# Patient Record
Sex: Female | Born: 1955 | Race: White | Hispanic: Yes | Marital: Married
Health system: Southern US, Community
[De-identification: ages and names within clinical notes are randomized; demographics above are authoritative.]

---

## 2006-08-07 ENCOUNTER — Ambulatory Visit (HOSPITAL_COMMUNITY): Admission: RE | Admit: 2006-08-07 | Discharge: 2006-08-07 | Payer: Self-pay | Admitting: General Surgery

## 2007-04-09 ENCOUNTER — Ambulatory Visit (HOSPITAL_COMMUNITY): Admission: RE | Admit: 2007-04-09 | Discharge: 2007-04-09 | Payer: Self-pay | Admitting: Family Medicine

## 2007-08-13 ENCOUNTER — Ambulatory Visit (HOSPITAL_COMMUNITY): Admission: RE | Admit: 2007-08-13 | Discharge: 2007-08-13 | Payer: Self-pay | Admitting: Family Medicine

## 2008-08-27 ENCOUNTER — Ambulatory Visit (HOSPITAL_COMMUNITY): Admission: RE | Admit: 2008-08-27 | Discharge: 2008-08-27 | Payer: Self-pay | Admitting: Family Medicine

## 2010-05-08 ENCOUNTER — Ambulatory Visit (HOSPITAL_COMMUNITY): Admission: RE | Admit: 2010-05-08 | Discharge: 2010-05-08 | Payer: Self-pay | Admitting: Family Medicine

## 2011-10-23 ENCOUNTER — Other Ambulatory Visit (HOSPITAL_COMMUNITY): Payer: Self-pay | Admitting: Family Medicine

## 2011-10-23 DIAGNOSIS — Z139 Encounter for screening, unspecified: Secondary | ICD-10-CM

## 2011-10-25 ENCOUNTER — Ambulatory Visit (HOSPITAL_COMMUNITY)
Admission: RE | Admit: 2011-10-25 | Discharge: 2011-10-25 | Disposition: A | Discharge status: Home / Self Care | Payer: PRIVATE HEALTH INSURANCE | Source: Ambulatory Visit | Attending: Family Medicine | Admitting: Family Medicine

## 2011-10-25 DIAGNOSIS — Z1231 Encounter for screening mammogram for malignant neoplasm of breast: Secondary | ICD-10-CM | POA: Insufficient documentation

## 2011-10-25 DIAGNOSIS — Z139 Encounter for screening, unspecified: Secondary | ICD-10-CM

## 2013-04-30 ENCOUNTER — Other Ambulatory Visit (HOSPITAL_COMMUNITY): Payer: Self-pay | Admitting: *Deleted

## 2013-04-30 DIAGNOSIS — Z139 Encounter for screening, unspecified: Secondary | ICD-10-CM

## 2013-05-04 ENCOUNTER — Ambulatory Visit (HOSPITAL_COMMUNITY)
Admission: RE | Admit: 2013-05-04 | Discharge: 2013-05-04 | Disposition: A | Discharge status: Home / Self Care | Payer: PRIVATE HEALTH INSURANCE | Source: Ambulatory Visit | Attending: *Deleted | Admitting: *Deleted

## 2013-05-04 DIAGNOSIS — Z1231 Encounter for screening mammogram for malignant neoplasm of breast: Secondary | ICD-10-CM | POA: Insufficient documentation

## 2013-05-04 DIAGNOSIS — Z139 Encounter for screening, unspecified: Secondary | ICD-10-CM

## 2014-05-04 ENCOUNTER — Other Ambulatory Visit (HOSPITAL_COMMUNITY): Payer: Self-pay | Admitting: *Deleted

## 2014-05-04 DIAGNOSIS — Z1231 Encounter for screening mammogram for malignant neoplasm of breast: Secondary | ICD-10-CM

## 2014-05-10 ENCOUNTER — Ambulatory Visit (HOSPITAL_COMMUNITY)
Admission: RE | Admit: 2014-05-10 | Discharge: 2014-05-10 | Disposition: A | Discharge status: Home / Self Care | Payer: PRIVATE HEALTH INSURANCE | Source: Ambulatory Visit | Attending: *Deleted | Admitting: *Deleted

## 2014-05-10 DIAGNOSIS — Z1231 Encounter for screening mammogram for malignant neoplasm of breast: Secondary | ICD-10-CM | POA: Diagnosis not present

## 2015-07-07 ENCOUNTER — Other Ambulatory Visit (HOSPITAL_COMMUNITY): Payer: Self-pay | Admitting: *Deleted

## 2015-07-07 DIAGNOSIS — Z1231 Encounter for screening mammogram for malignant neoplasm of breast: Secondary | ICD-10-CM

## 2015-07-13 ENCOUNTER — Ambulatory Visit (HOSPITAL_COMMUNITY)
Admission: RE | Admit: 2015-07-13 | Discharge: 2015-07-13 | Disposition: A | Discharge status: Home / Self Care | Payer: PRIVATE HEALTH INSURANCE | Source: Ambulatory Visit | Attending: *Deleted | Admitting: *Deleted

## 2015-07-13 DIAGNOSIS — Z1231 Encounter for screening mammogram for malignant neoplasm of breast: Secondary | ICD-10-CM | POA: Diagnosis present

## 2016-06-15 ENCOUNTER — Other Ambulatory Visit (HOSPITAL_COMMUNITY): Payer: Self-pay | Admitting: *Deleted

## 2016-06-15 DIAGNOSIS — R1084 Generalized abdominal pain: Secondary | ICD-10-CM

## 2016-06-20 ENCOUNTER — Ambulatory Visit (HOSPITAL_COMMUNITY)
Admission: RE | Admit: 2016-06-20 | Discharge: 2016-06-20 | Disposition: A | Discharge status: Home / Self Care | Payer: Self-pay | Source: Ambulatory Visit | Attending: *Deleted | Admitting: *Deleted

## 2016-06-20 DIAGNOSIS — R1011 Right upper quadrant pain: Secondary | ICD-10-CM | POA: Insufficient documentation

## 2016-06-20 DIAGNOSIS — Z90710 Acquired absence of both cervix and uterus: Secondary | ICD-10-CM | POA: Insufficient documentation

## 2016-06-20 DIAGNOSIS — Z9049 Acquired absence of other specified parts of digestive tract: Secondary | ICD-10-CM | POA: Insufficient documentation

## 2016-06-20 DIAGNOSIS — R1084 Generalized abdominal pain: Secondary | ICD-10-CM

## 2016-06-20 DIAGNOSIS — Q453 Other congenital malformations of pancreas and pancreatic duct: Secondary | ICD-10-CM | POA: Insufficient documentation

## 2016-07-26 ENCOUNTER — Other Ambulatory Visit (HOSPITAL_COMMUNITY): Payer: Self-pay | Admitting: *Deleted

## 2016-07-26 DIAGNOSIS — Z1231 Encounter for screening mammogram for malignant neoplasm of breast: Secondary | ICD-10-CM

## 2016-08-08 ENCOUNTER — Ambulatory Visit (HOSPITAL_COMMUNITY): Payer: PRIVATE HEALTH INSURANCE

## 2016-08-09 ENCOUNTER — Ambulatory Visit (HOSPITAL_COMMUNITY)
Admission: RE | Admit: 2016-08-09 | Discharge: 2016-08-09 | Disposition: A | Discharge status: Home / Self Care | Payer: PRIVATE HEALTH INSURANCE | Source: Ambulatory Visit | Attending: *Deleted | Admitting: *Deleted

## 2016-08-09 DIAGNOSIS — Z1231 Encounter for screening mammogram for malignant neoplasm of breast: Secondary | ICD-10-CM

## 2016-10-23 IMAGING — MG MM DIGITAL SCREENING
8 series · 9 of 24 positions shown · non-contrast
Comparison: Previous exam(s).

CLINICAL DATA: Screening.

EXAM:
DIGITAL SCREENING BILATERAL MAMMOGRAM WITH 3D TOMO WITH CAD

[R MLO]
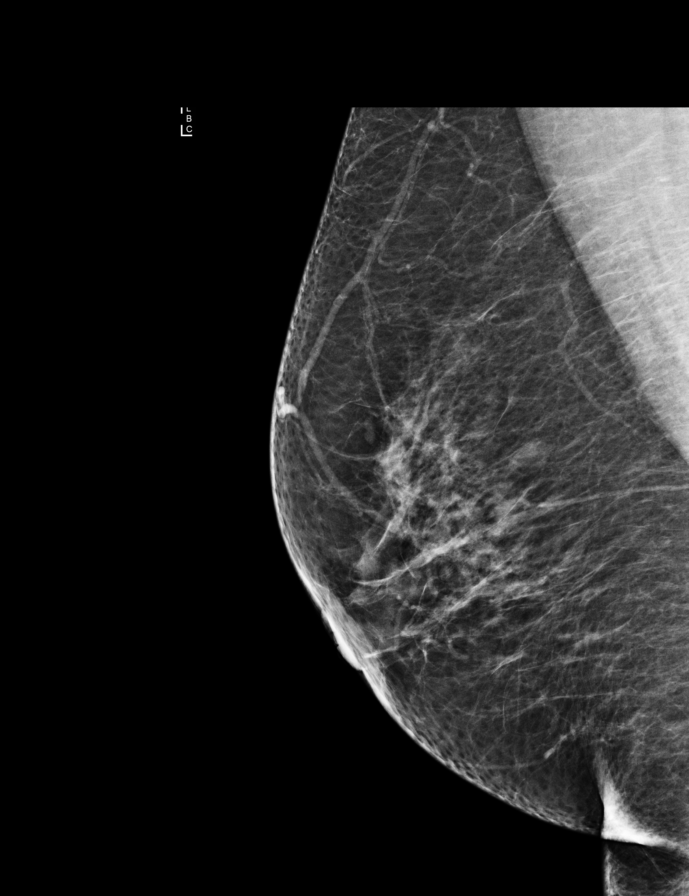

[L CC]
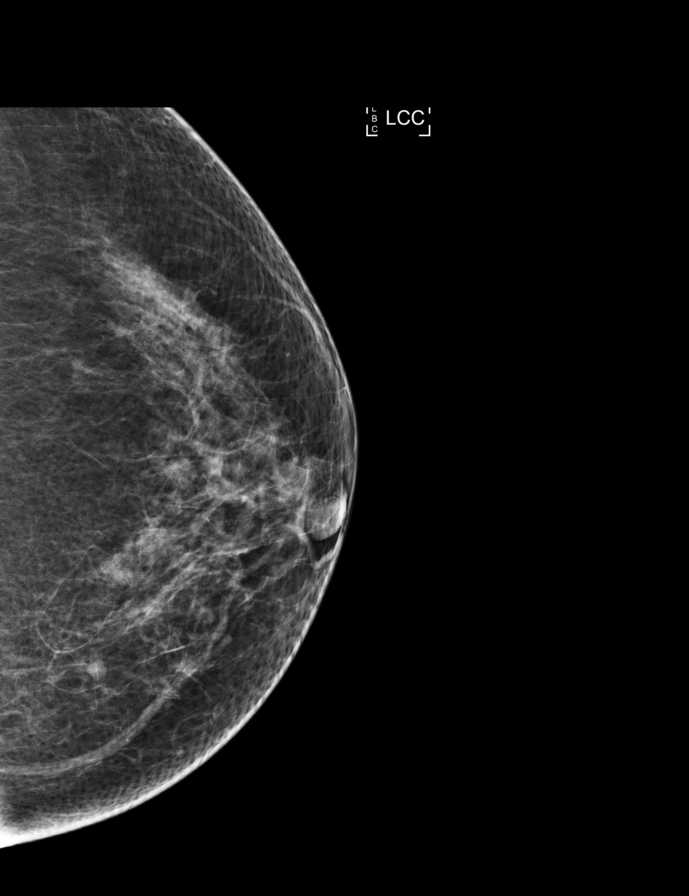

[R CC]
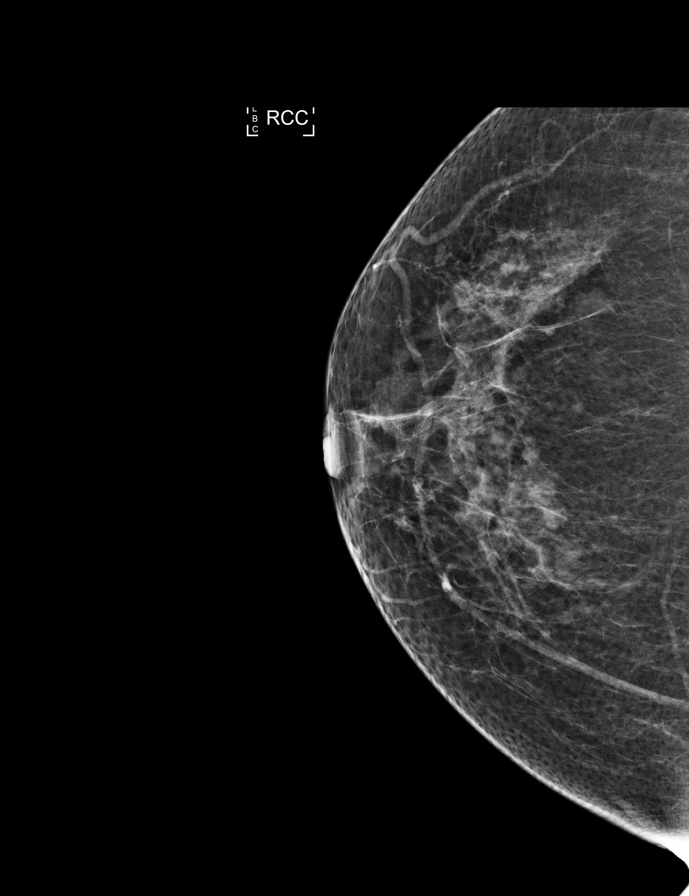

[L MLO]
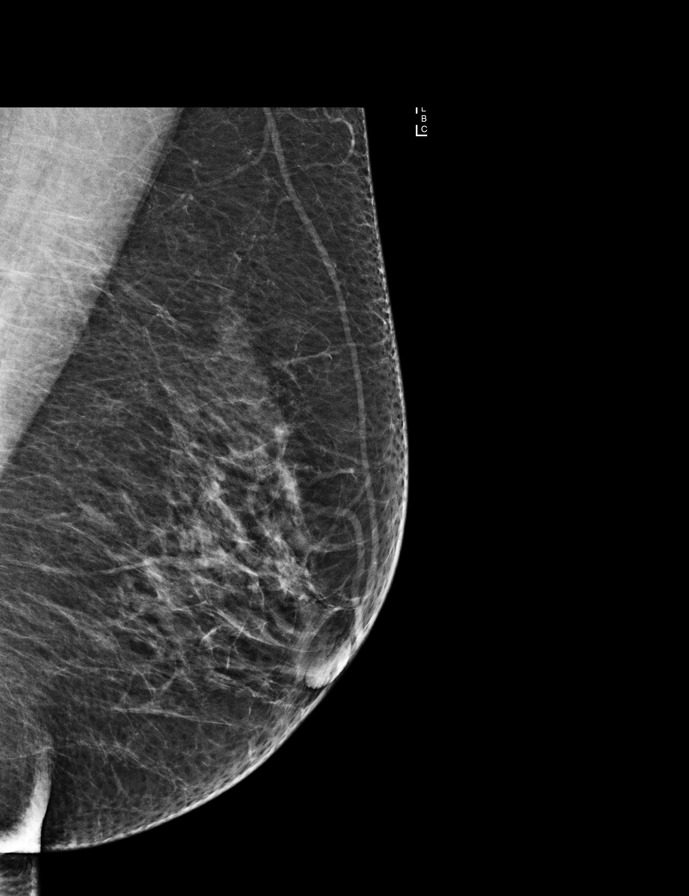

[R CC tomo · 2 of 55 frames shown]
[frame 18/55]
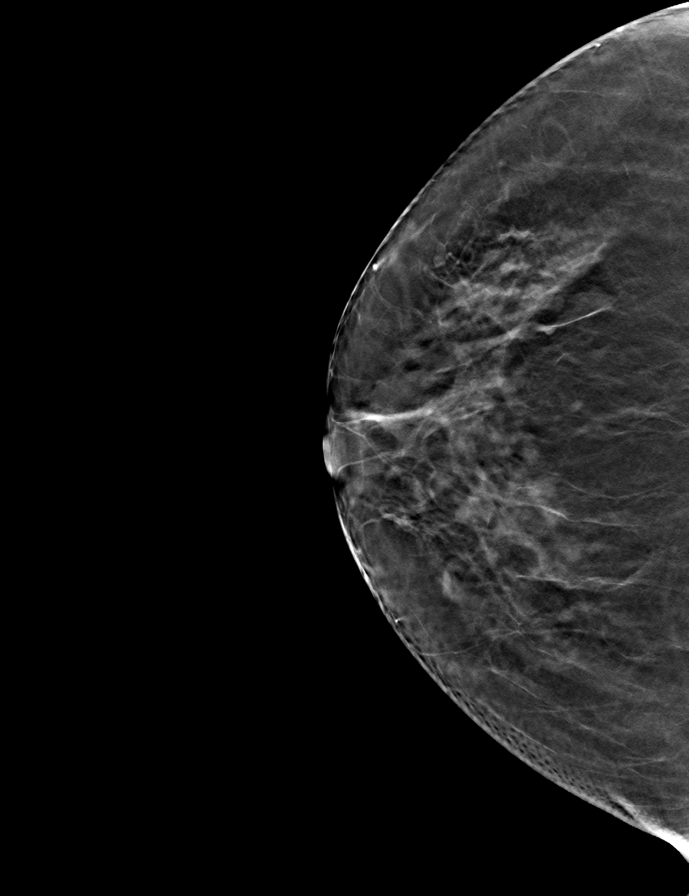
[frame 28/55]
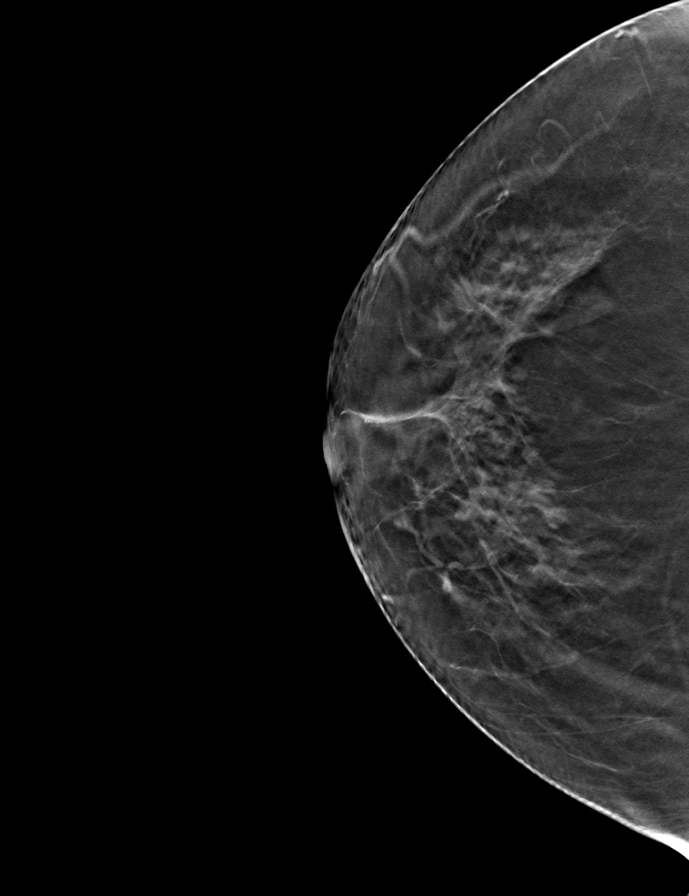

[R MLO tomo · tomo slice 31/62.0]
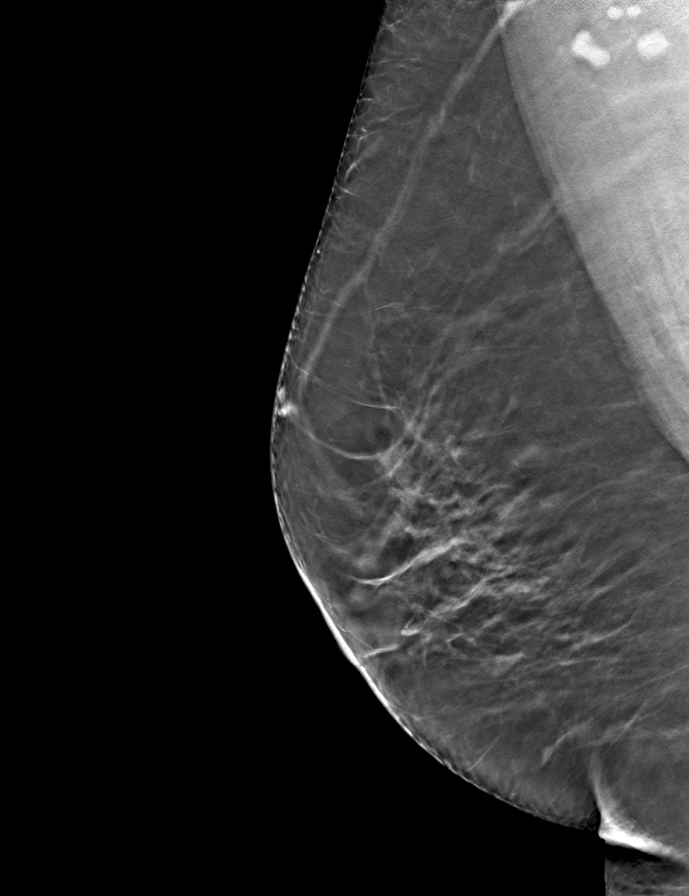

[L MLO tomo · tomo slice 29/57.0]
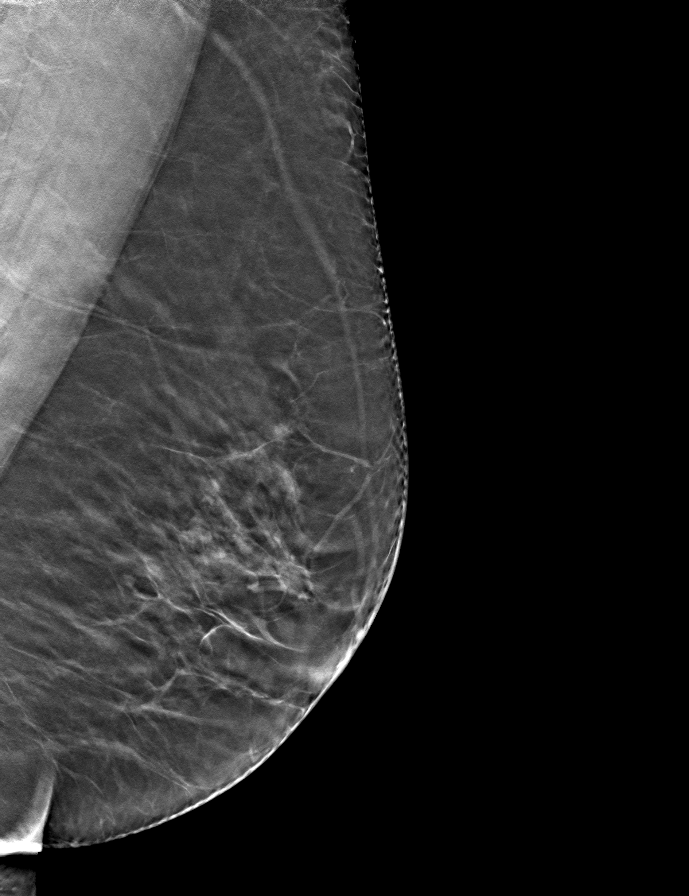

[L CC tomo · tomo slice 30/59.0]
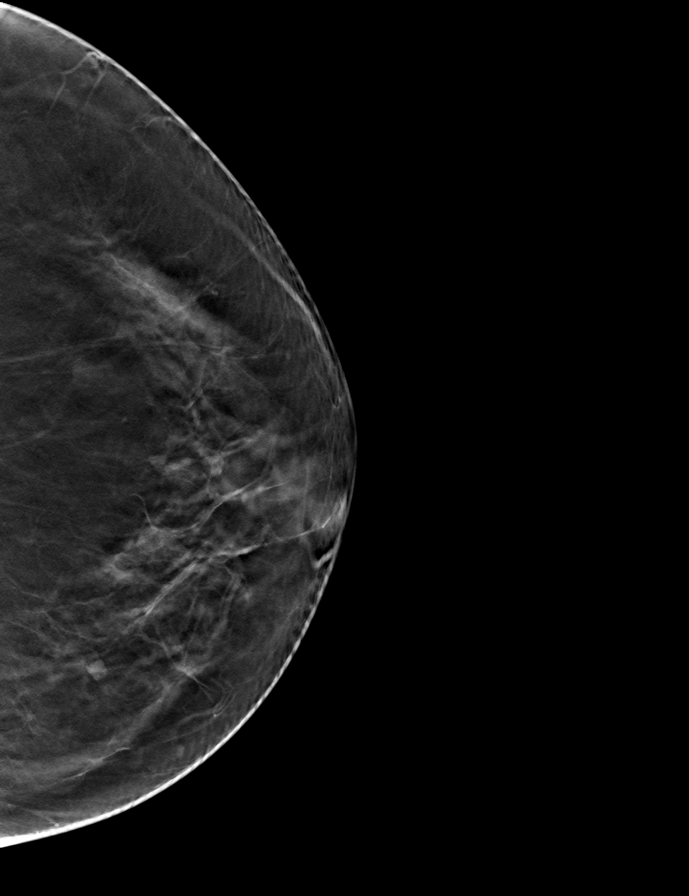

[9 of 24 positions shown; findings below may reference images not displayed]

ACR Breast Density Category b: There are scattered areas of
fibroglandular density.
FINDINGS: There are no findings suspicious for malignancy. Images were
processed with CAD.
IMPRESSION: No mammographic evidence of malignancy. A result letter of this
screening mammogram will be mailed directly to the patient.

RECOMMENDATION:
Screening mammogram in one year. (Code:[id number]-[id number])

BI-RADS CATEGORY  1: Negative.

## 2017-04-27 IMAGING — US US PELVIS COMPLETE
1 series · 14 of 25 positions shown · non-contrast
Comparison: None.

CLINICAL DATA: Chronic generalized abdominal pain.

EXAM:
TRANSABDOMINAL ULTRASOUND OF PELVIS
TECHNIQUE: Transabdominal ultrasound examination of the pelvis was performed
including evaluation of the uterus, ovaries, adnexal regions, and
pelvic cul-de-sac.

[Series 1: us pelvis complete · 0.26mm/px · 14 of 26 slices shown]
[im 1/26]
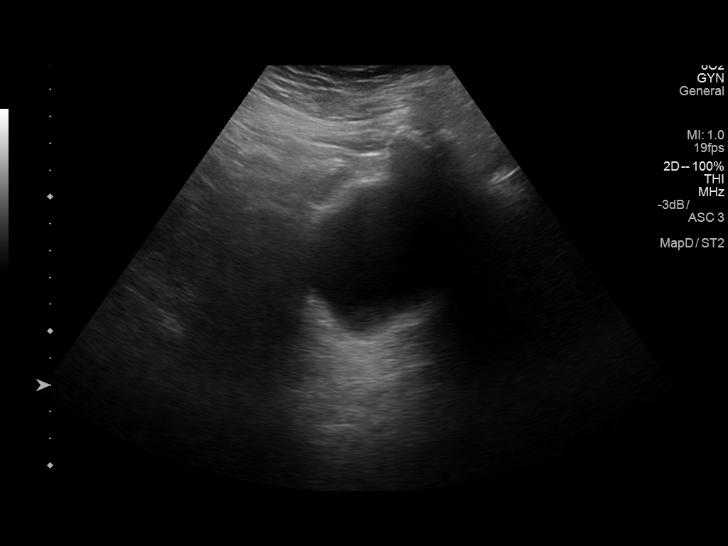
[im 3/26]
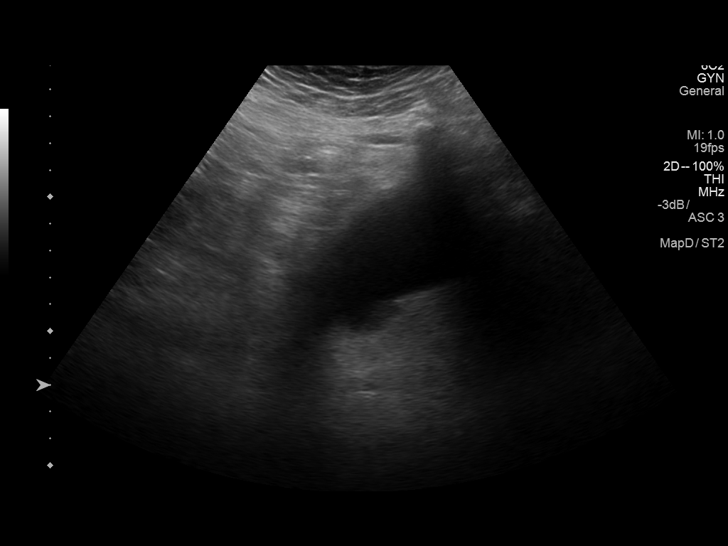
[im 5/26]
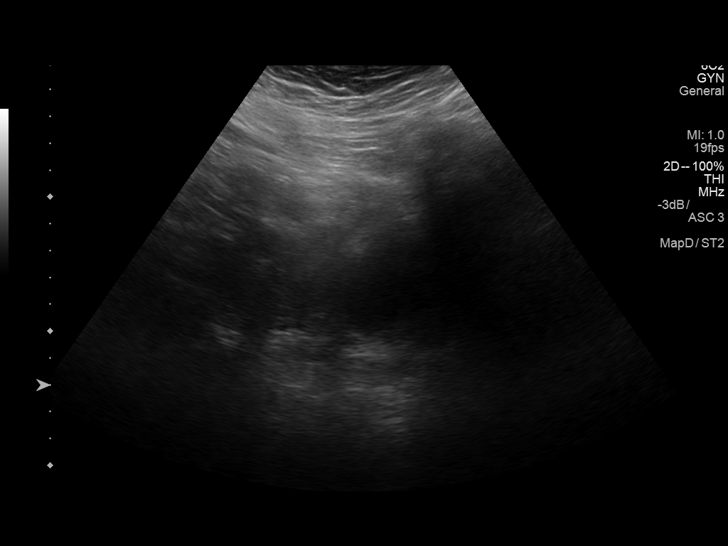
[im 7/26]
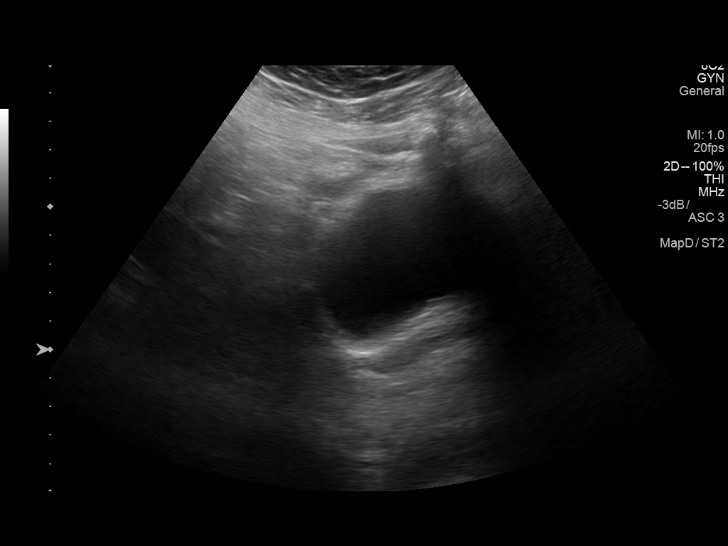
[im 9/26]
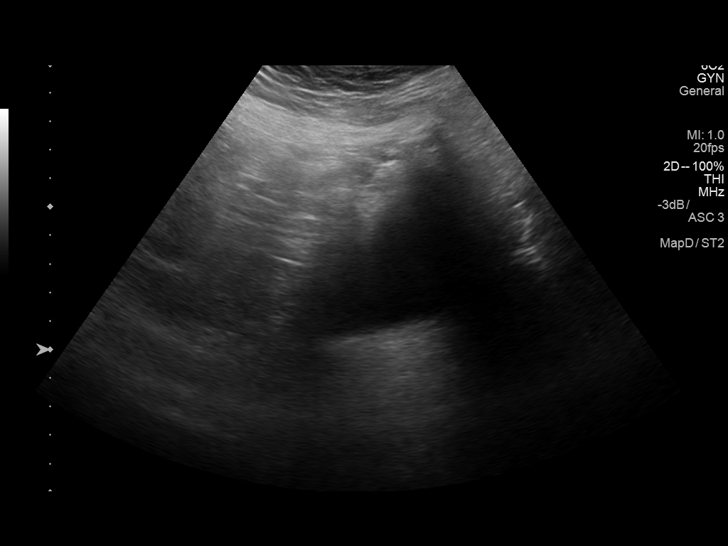
[im 10/26]
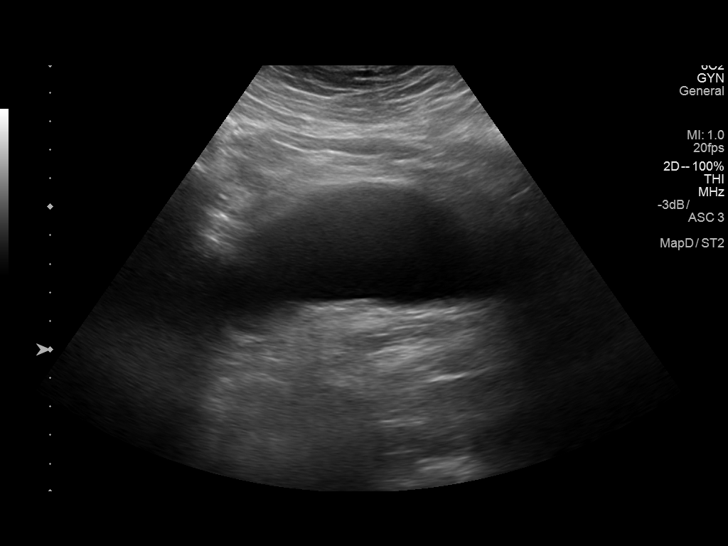
[im 12/26]
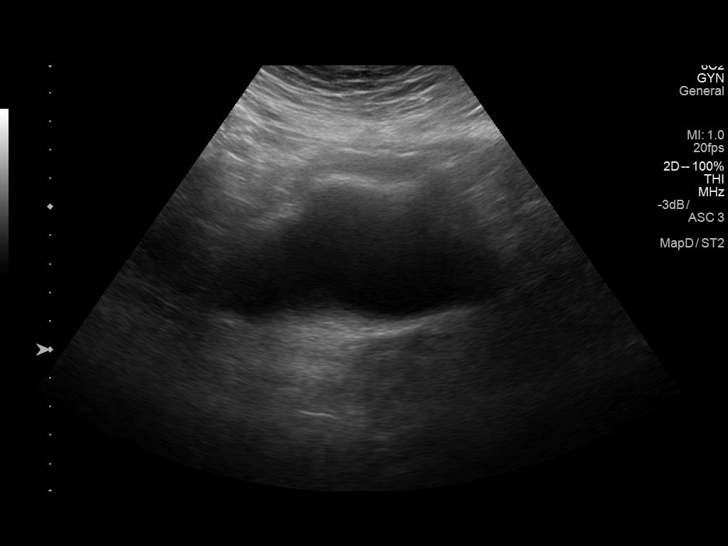
[im 14/26]
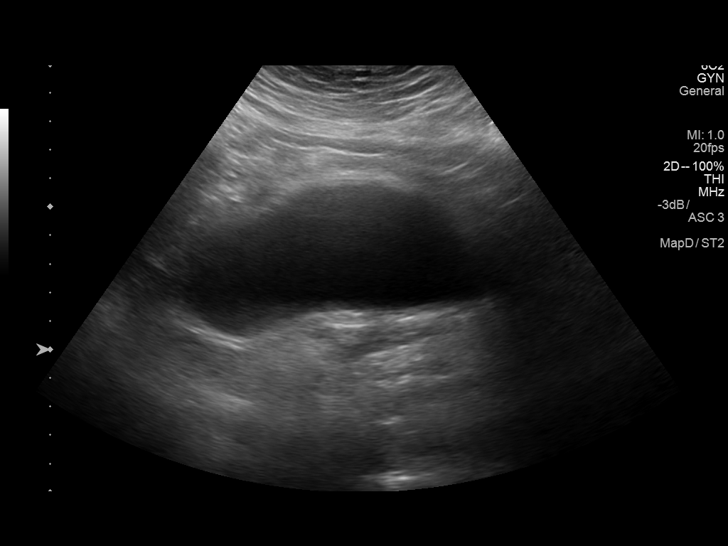
[im 16/26]
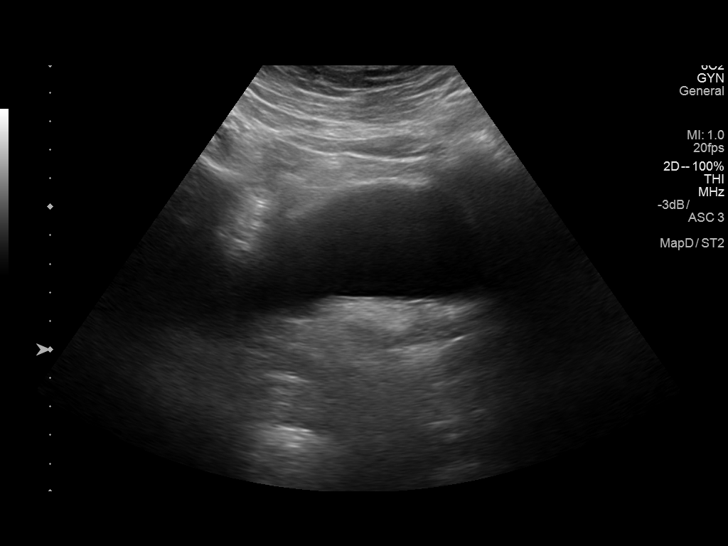
[im 17/26]
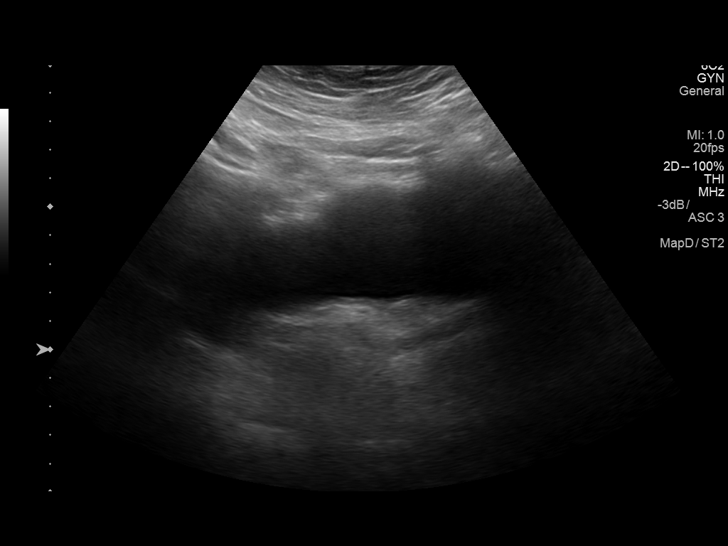
[im 19/26]
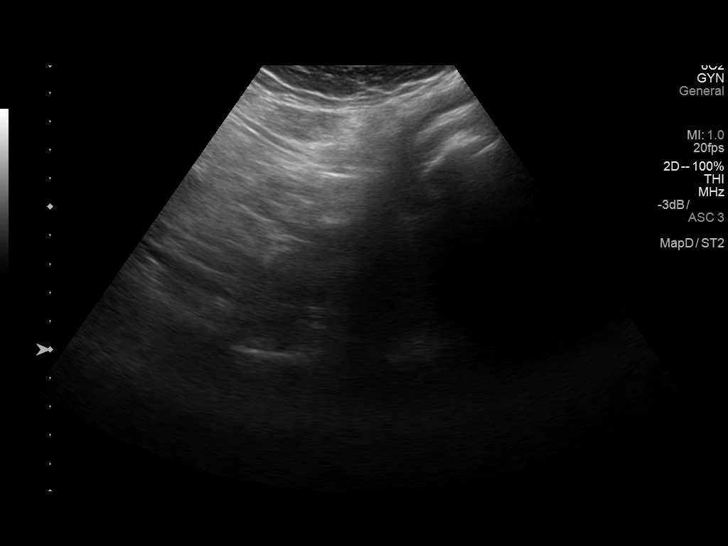
[im 21/26]
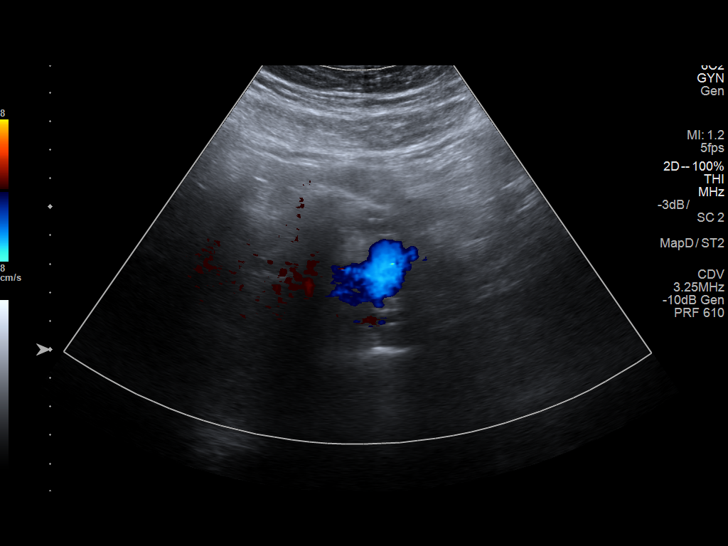
[im 23/26]
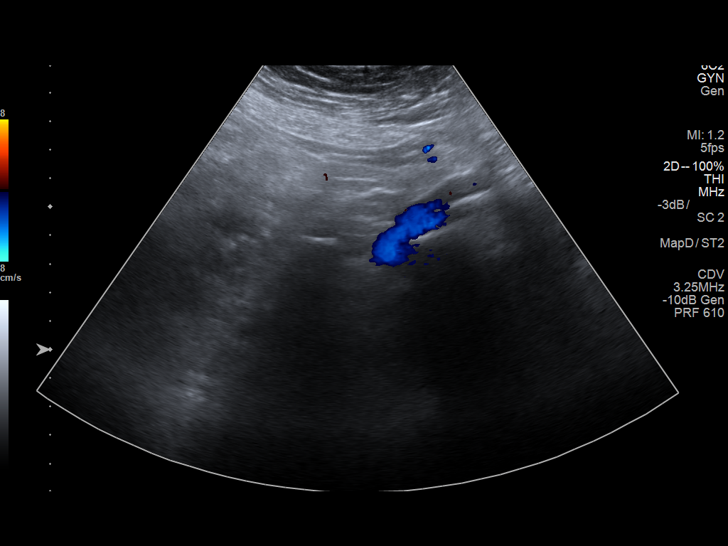
[im 26/26]
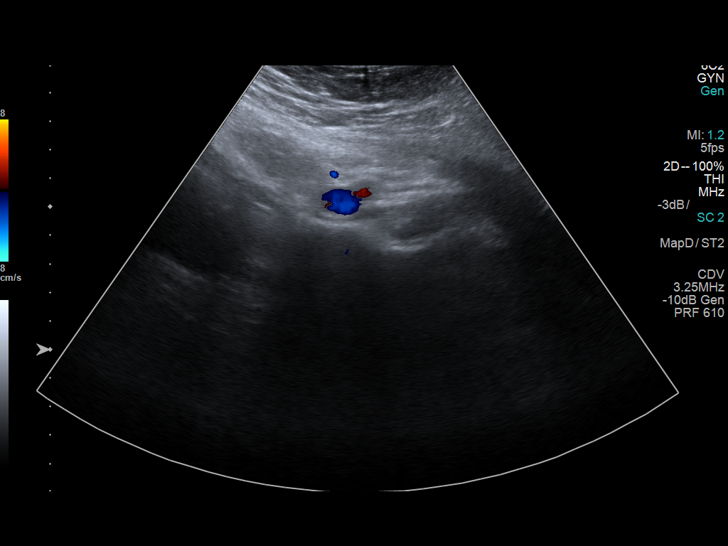

[14 of 25 positions shown; findings below may reference images not displayed]

FINDINGS: Status post hysterectomy.

Right ovary

Not visualized.

Left ovary

Not visualized.

Other findings:  No abnormal free fluid.
IMPRESSION: Status post hysterectomy. Ovaries not visualized. No adnexal mass or
other abnormality seen in the pelvis.

## 2017-04-27 IMAGING — US US ABDOMEN COMPLETE
1 series · 13 of 25 positions shown · non-contrast
Comparison: None.

CLINICAL DATA: Generalized abdominal pain. Right upper quadrant
abdominal pain after eating with bloating for the past 2 years.
History of cholecystectomy.

EXAM:
ABDOMEN ULTRASOUND COMPLETE

[Series 1: us abdomen complete · 0.21mm/px · 13 of 104 slices shown]
[im 1/104]
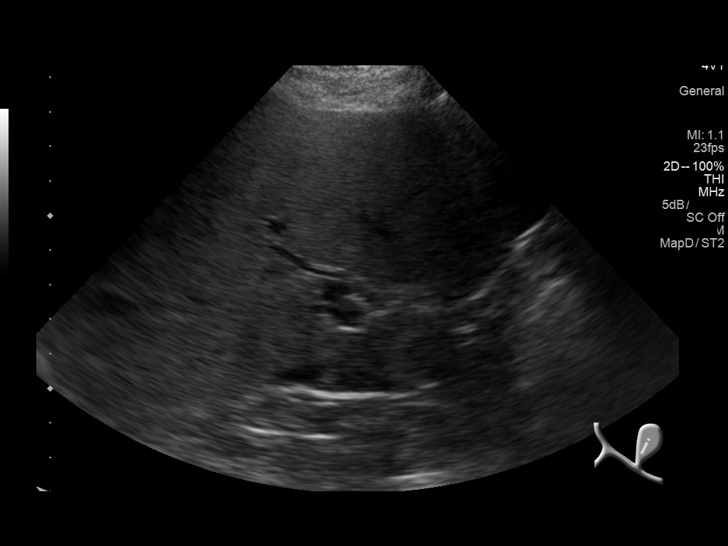
[im 9/104]
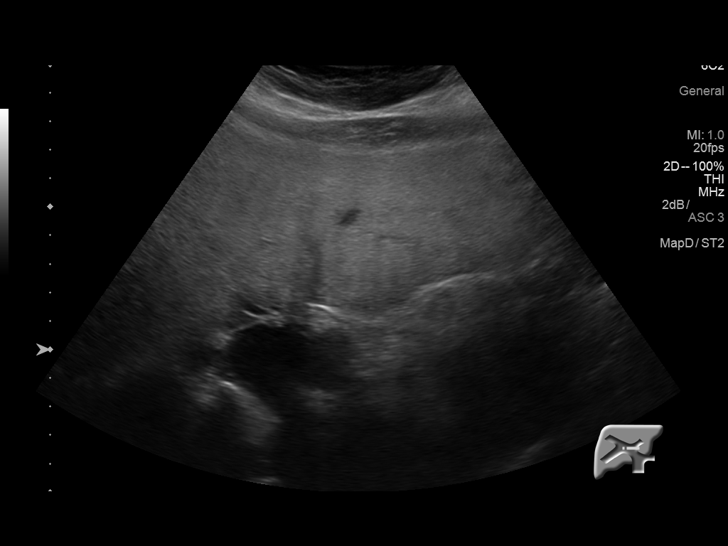
[im 18/104]
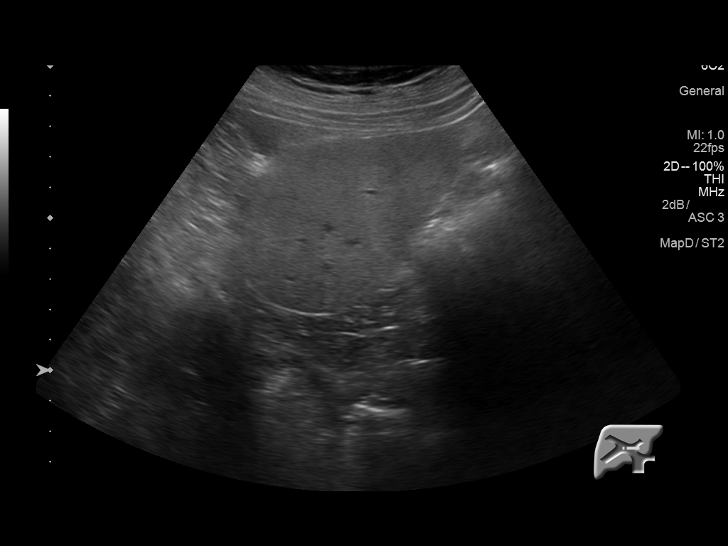
[im 26/104]
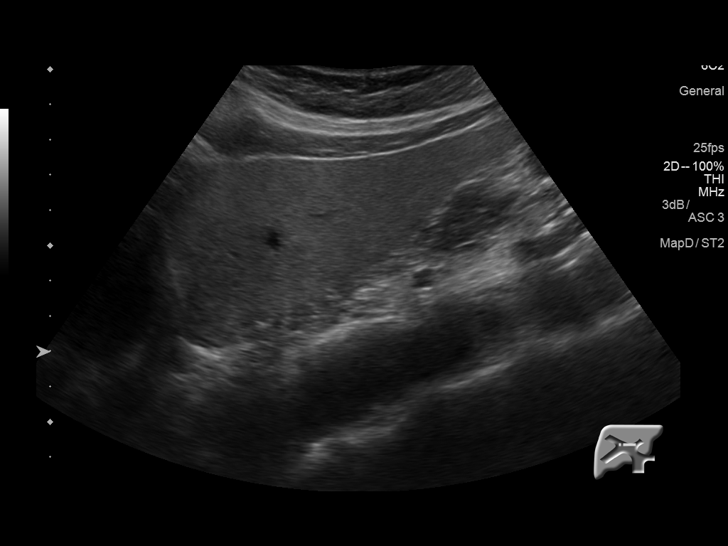
[im 35/104]
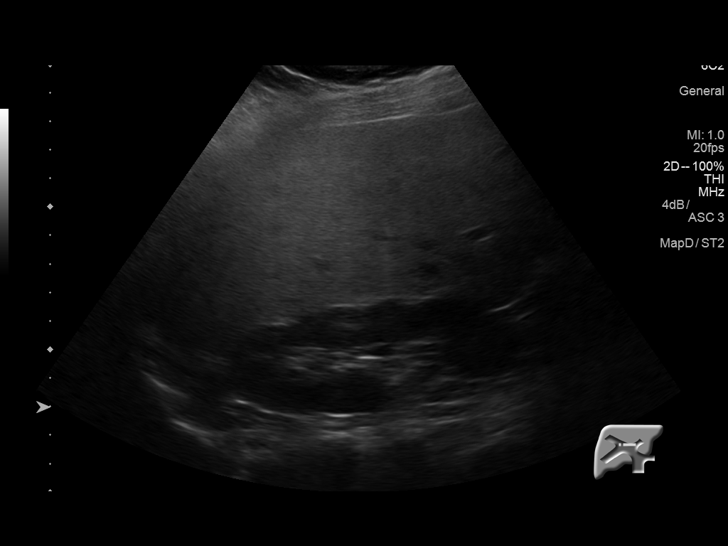
[im 43/104]
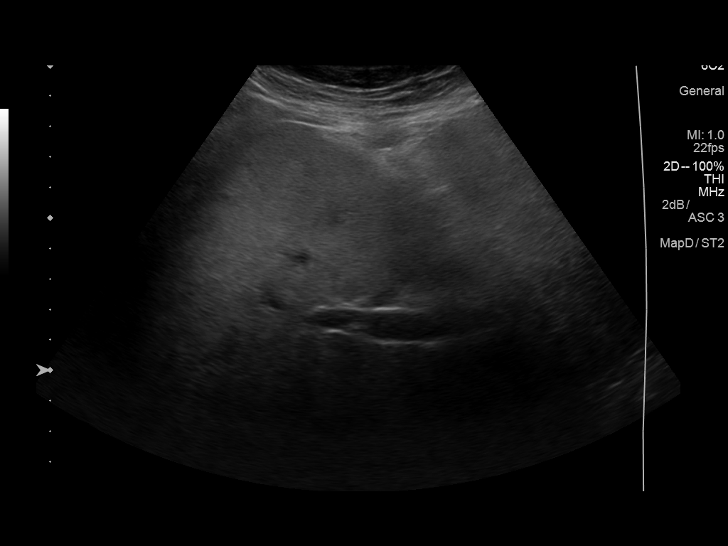
[im 52/104]
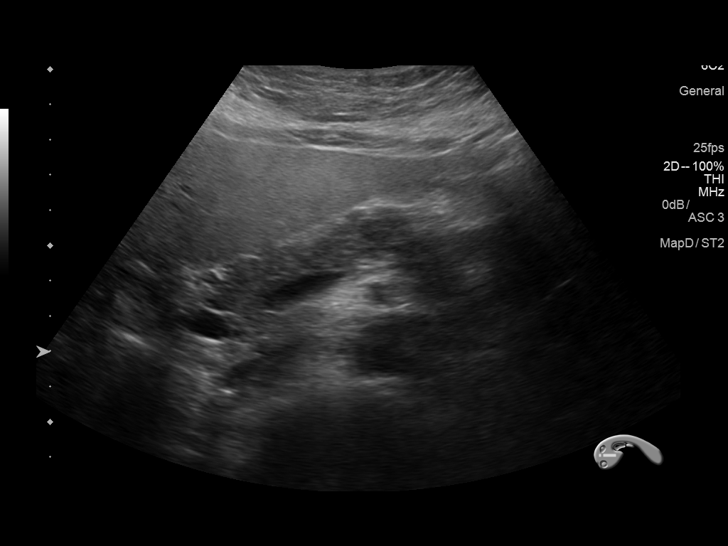
[im 61/104]
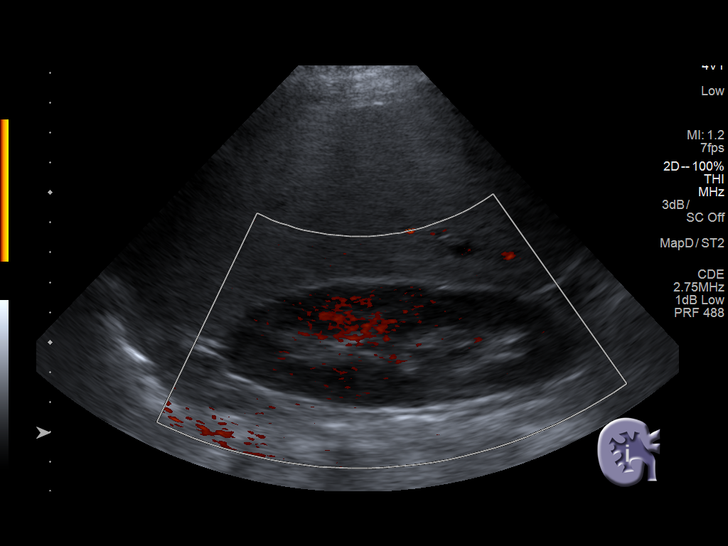
[im 69/104]
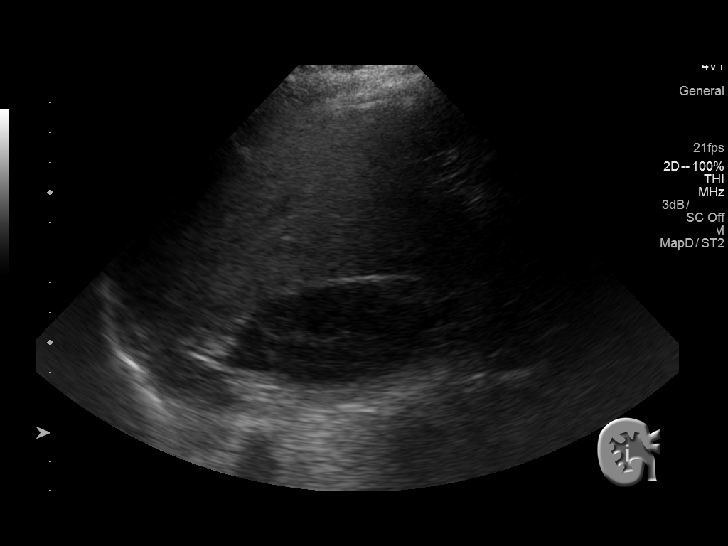
[im 78/104]
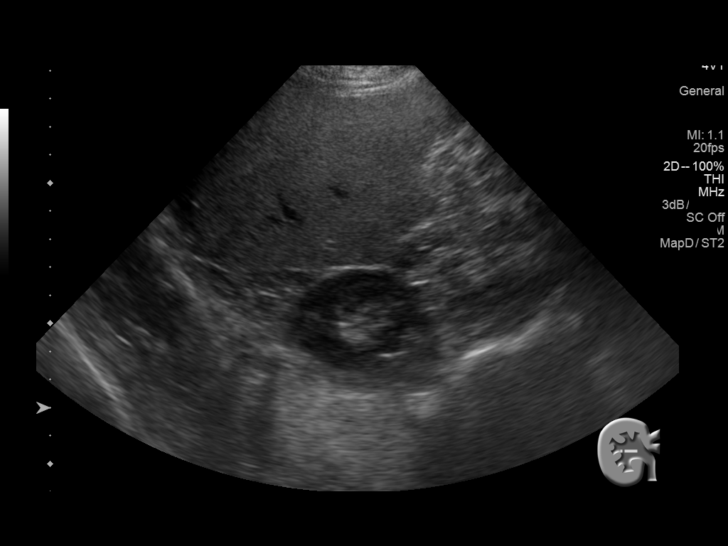
[im 86/104]
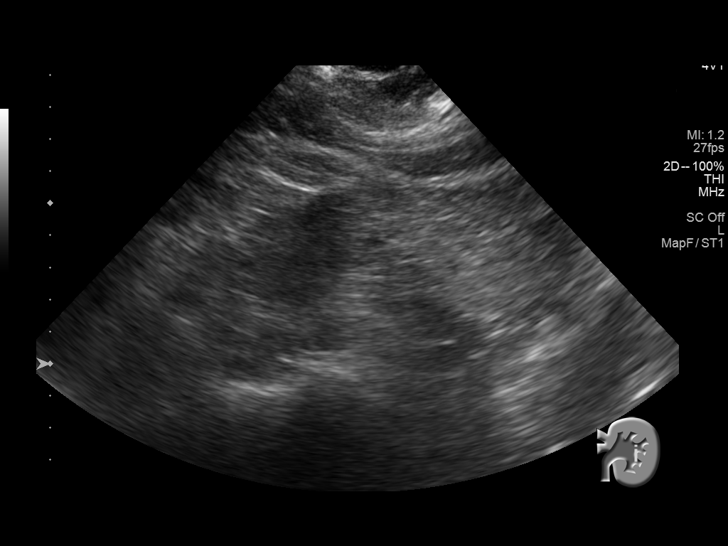
[im 95/104]
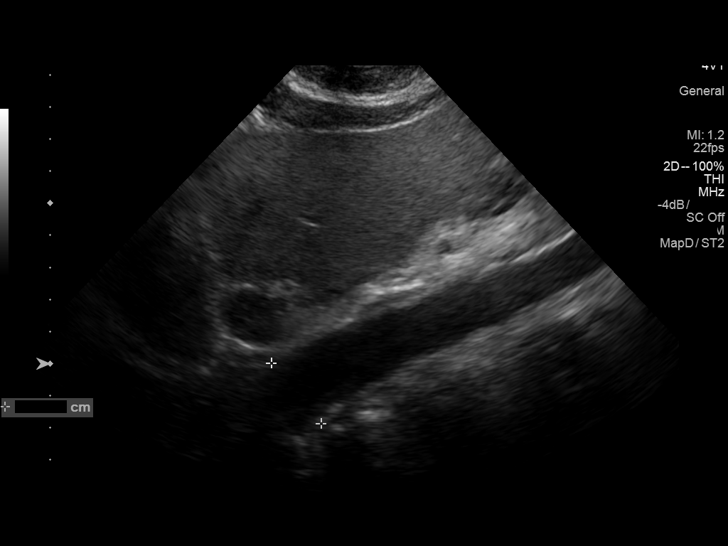
[im 104/104]
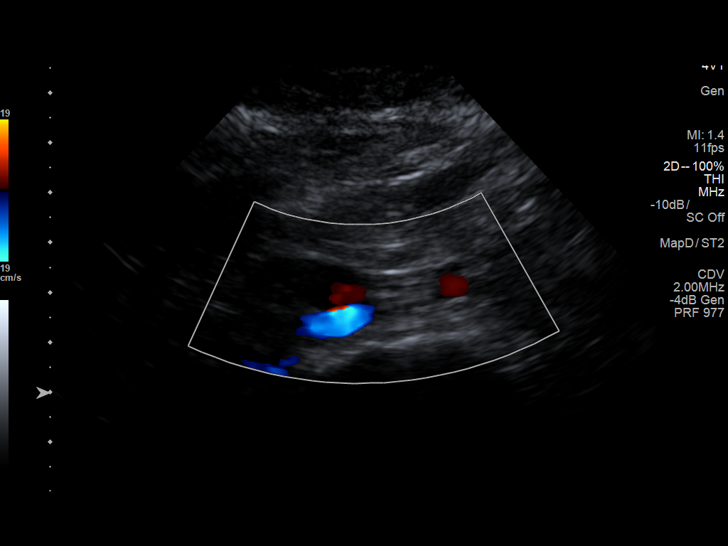

[13 of 25 positions shown; findings below may reference images not displayed]

FINDINGS: Gallbladder: Surgically absent

Common bile duct: Diameter: Normal in size measuring 5.7 mm in
diameter

Liver: There is mild diffuse slightly coarsened echogenicity of the
hepatic parenchyma (representative image 17). No discrete hepatic
lesions. No intrahepatic biliary duct dilatation. No ascites.

IVC: No abnormality visualized.

Pancreas: The head and neck of the pancreas appears somewhat
enlarged and heterogeneous (representative image 52). There is
obscuration of the pancreatic body and tail secondary to overlying
bowel gas.

Spleen: Normal in size measuring 6.1 cm in length

Right Kidney: Normal cortical thickness, echogenicity and size,
measuring 11.6 cm in length. No focal renal lesions. No echogenic
renal stones. No urinary obstruction.

Left Kidney: Normal cortical thickness, echogenicity and size,
measuring 11.8 cm in length. No focal renal lesions. No echogenic
renal stones. No urinary obstruction.

Abdominal aorta: No aneurysm visualized.

Other findings: None.
IMPRESSION: 1. Potentially enlarged and heterogeneous appearing pancreatic head
and neck, nonspecific though could be seen in the setting of
pancreatitis. Correlation with serum amylase and lipase levels could
be performed as clinically indicated.
2. Otherwise, no explanation for patient's chronic right upper
quadrant abdominal pain and bloating.
3. Findings suggestive of hepatic steatosis. Correlation with LFTs
is recommended.
4. Post cholecystectomy.

## 2017-11-20 IMAGING — MG 2D DIGITAL SCREENING BILATERAL MAMMOGRAM WITH CAD AND ADJUNCT TO
8 series · 8 of 24 positions shown · non-contrast
Comparison: Previous exam(s).

CLINICAL DATA: Screening.

EXAM:
2D DIGITAL SCREENING BILATERAL MAMMOGRAM WITH CAD AND ADJUNCT TOMO

[R MLO]
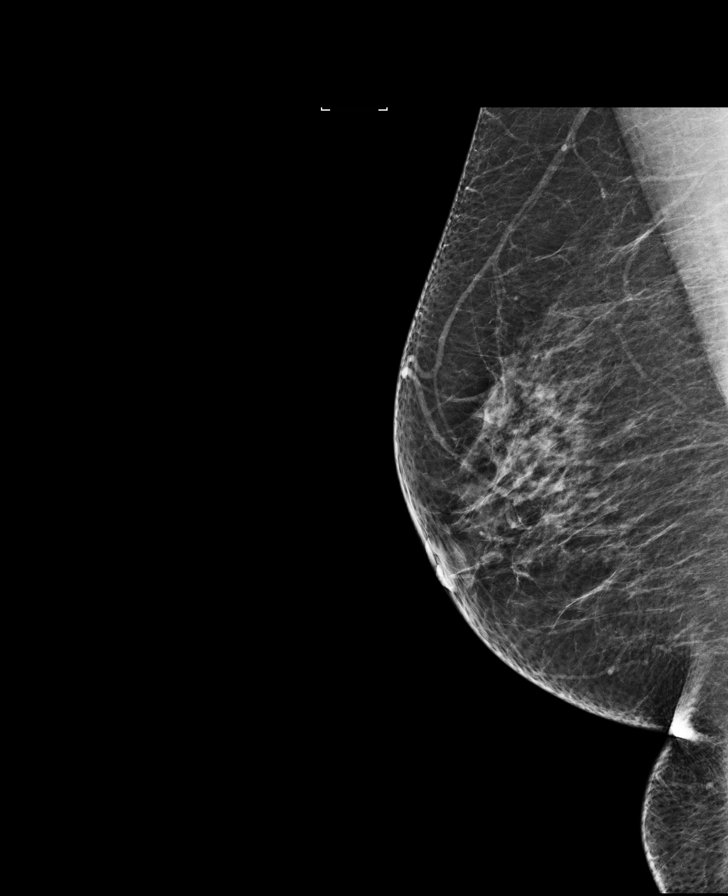

[L MLO]
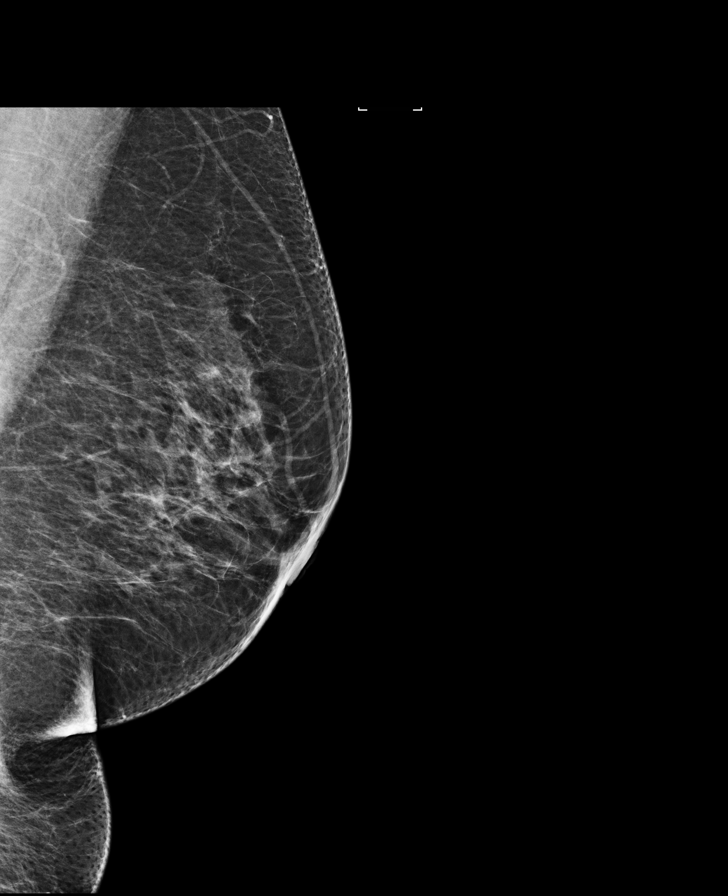

[L CC]
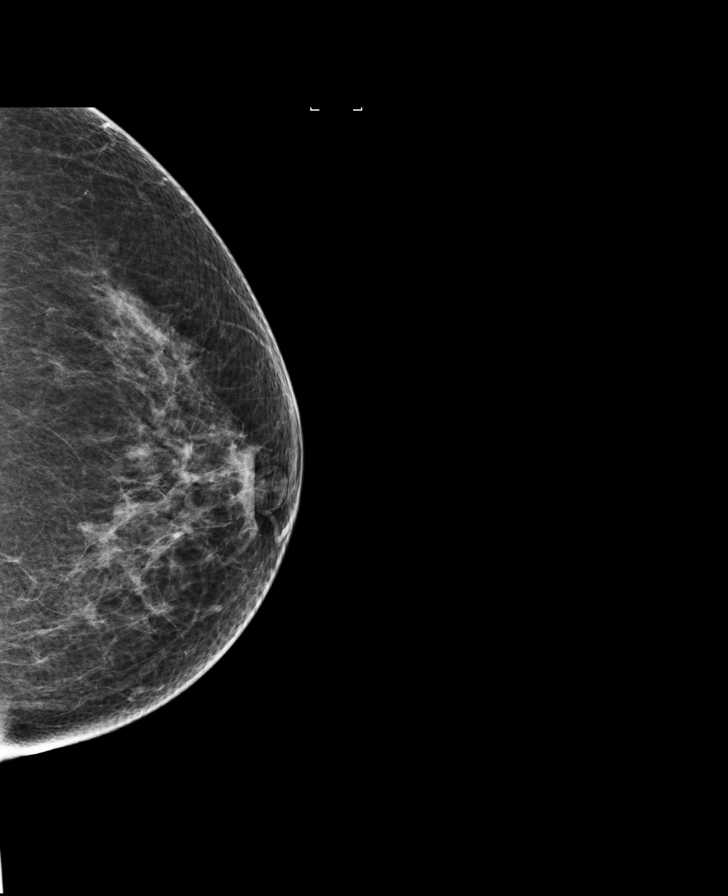

[R CC]
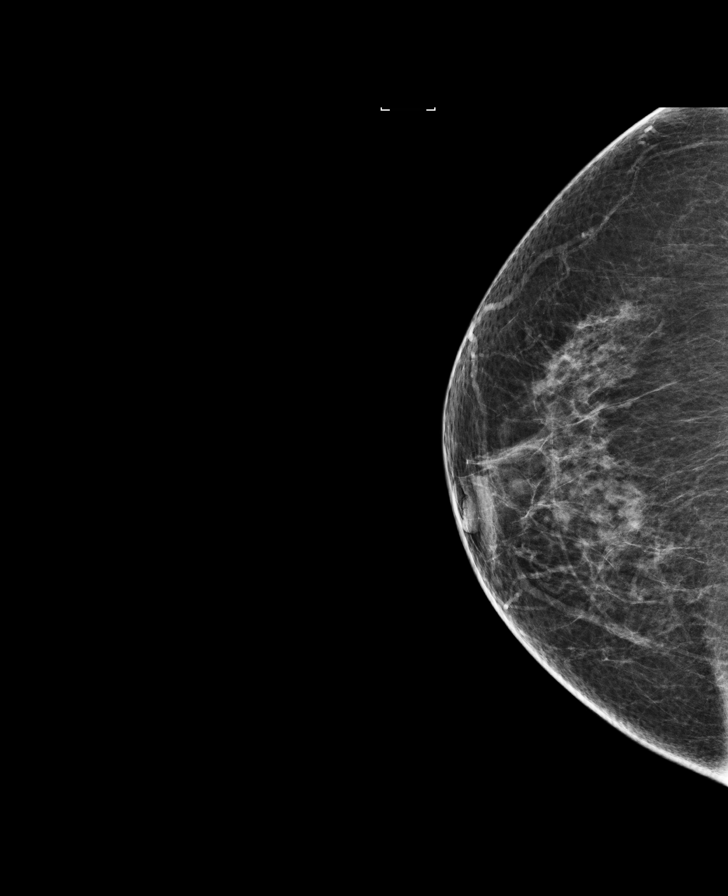

[L MLO tomo · tomo slice 32/63.0]
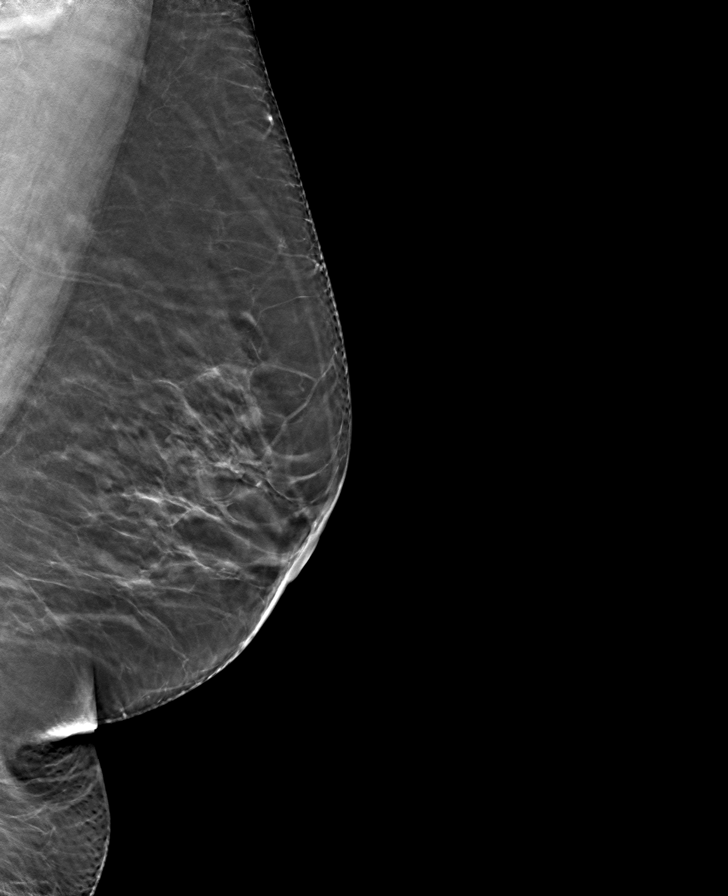

[R CC tomo · tomo slice 33/65.0]
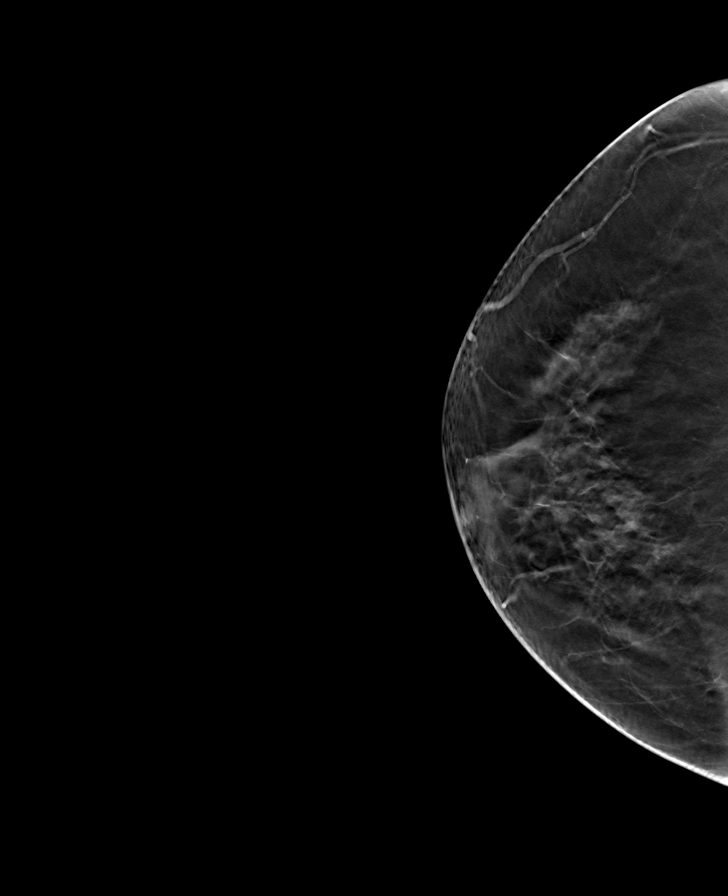

[L CC tomo · tomo slice 33/64.0]
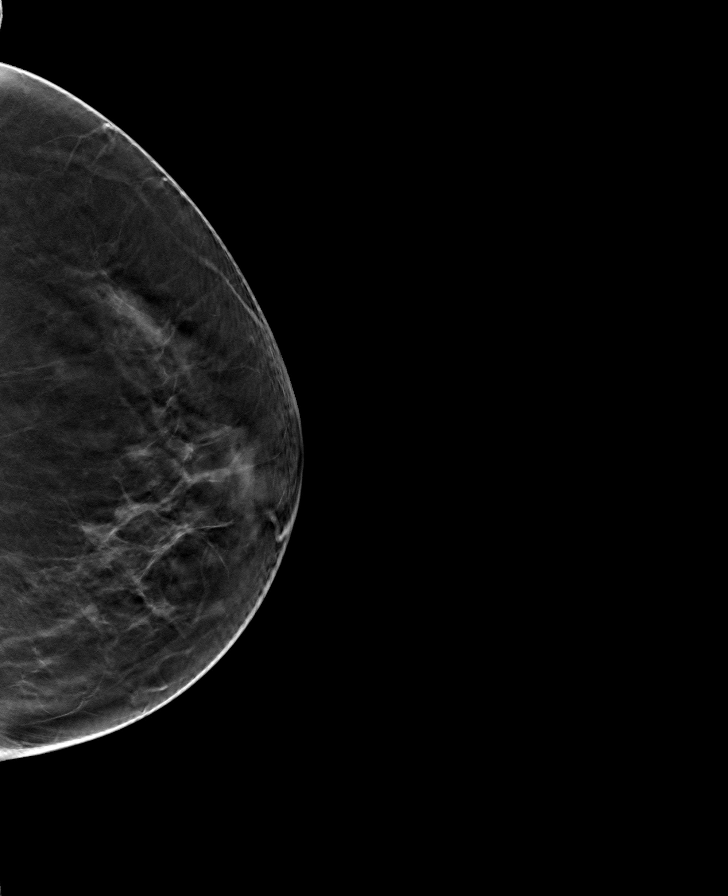

[R MLO tomo · tomo slice 31/62.0]
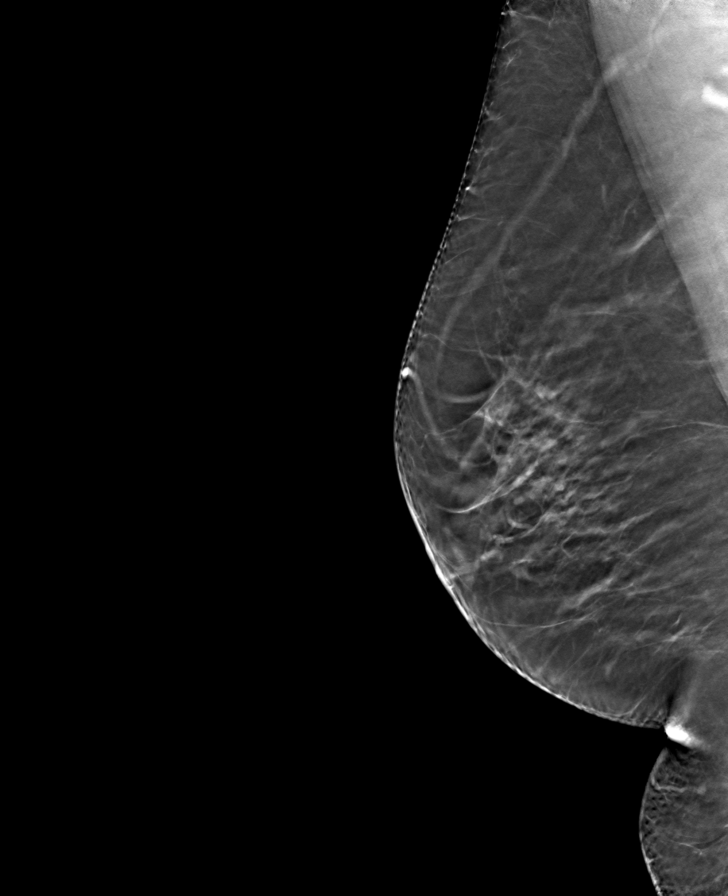

[8 of 24 positions shown; findings below may reference images not displayed]

ACR Breast Density Category b: There are scattered areas of
fibroglandular density.
FINDINGS: There are no findings suspicious for malignancy. Images were
processed with CAD.
IMPRESSION: No mammographic evidence of malignancy. A result letter of this
screening mammogram will be mailed directly to the patient.

RECOMMENDATION:
Screening mammogram in one year. (Code:[id number])

BI-RADS CATEGORY  1: Negative.

## 2022-03-12 ENCOUNTER — Ambulatory Visit: Payer: Medicare Other
